# Patient Record
Sex: Male | Born: 1985 | Race: White | Hispanic: No | Marital: Single
Health system: Southern US, Community
[De-identification: ages and names within clinical notes are randomized; demographics above are authoritative.]

---

## 2006-06-08 ENCOUNTER — Emergency Department: Payer: Self-pay | Admitting: General Practice

## 2007-04-09 ENCOUNTER — Emergency Department: Payer: Self-pay | Admitting: Unknown Physician Specialty

## 2010-03-20 ENCOUNTER — Emergency Department: Payer: Self-pay | Admitting: Emergency Medicine

## 2011-10-06 IMAGING — CT CT HEAD WITHOUT CONTRAST
2 series · 15 of 30 positions shown, 19 images · non-contrast
Comparison: none

REASON FOR EXAM: mass and tenderness behind right ear
COMMENTS:   LMP: (Male)

[Series 2: without · axial · non-contrast · 0.50mm/px · z∈[-218,-83]mm · 13 of 33 slices shown, 17 images]
[im 3/33  brain]
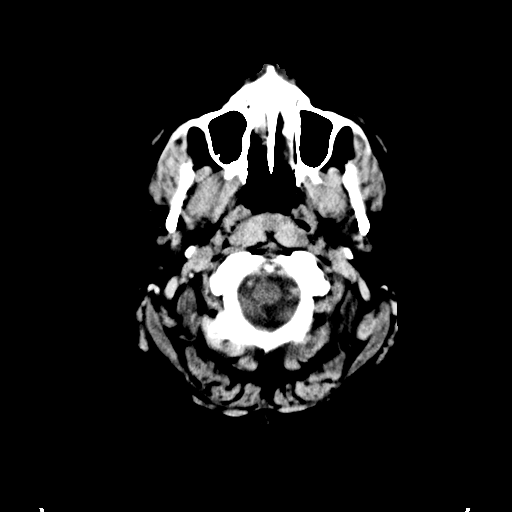
[im 3/33  bone]
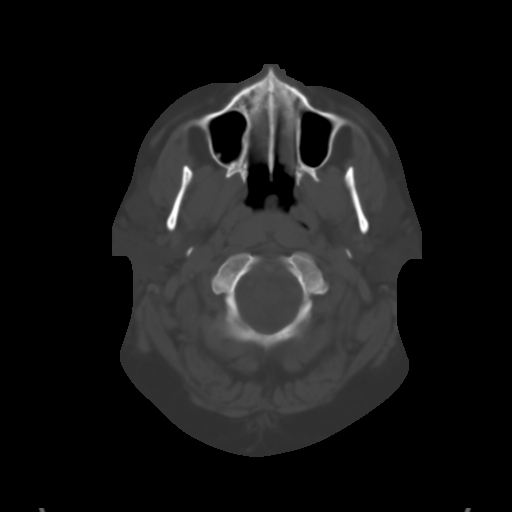
[im 5/33  brain]
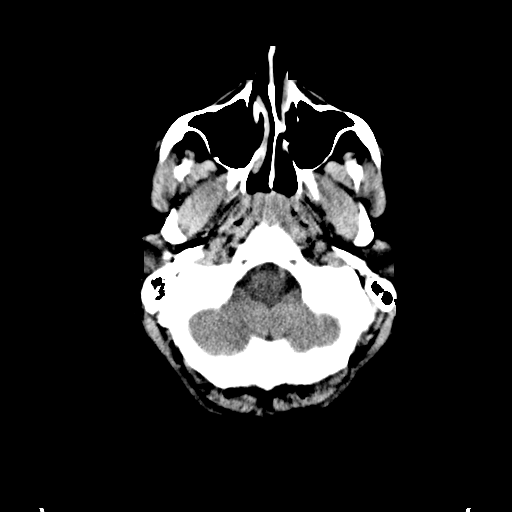
[im 7/33  brain]
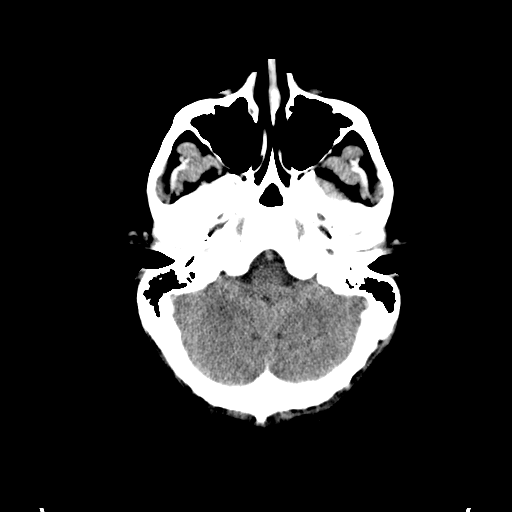
[im 10/33  brain]
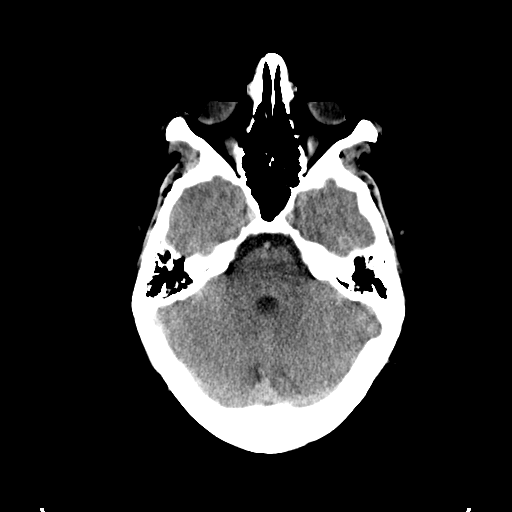
[im 12/33  brain]
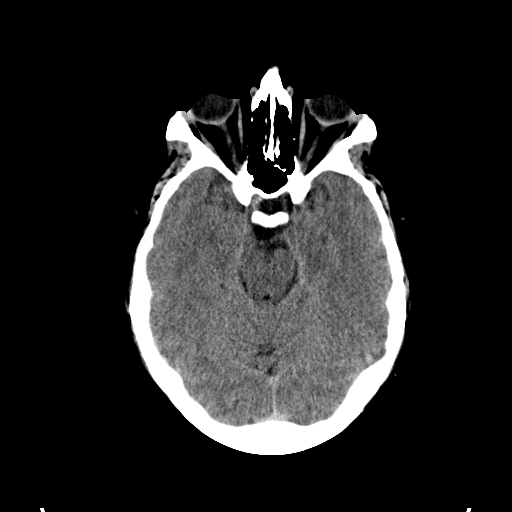
[im 12/33  bone]
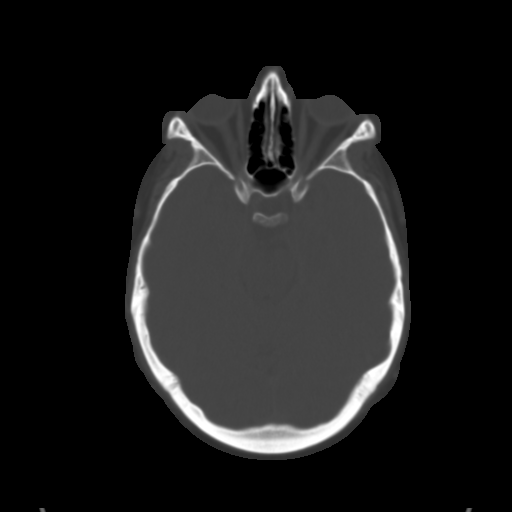
[im 14/33  brain]
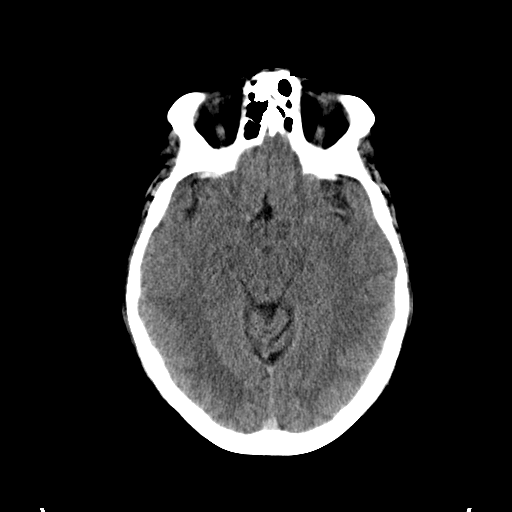
[im 17/33  brain]
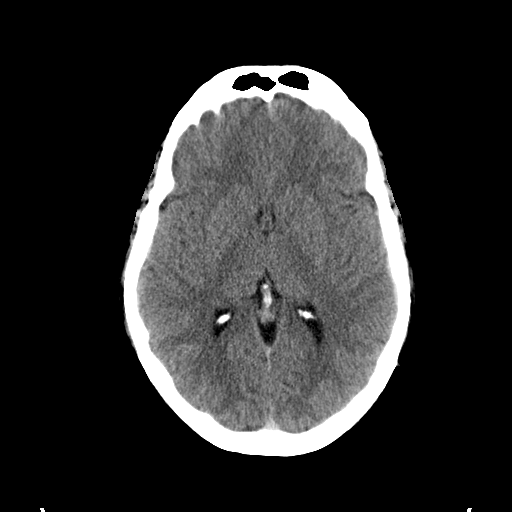
[im 19/33  brain]
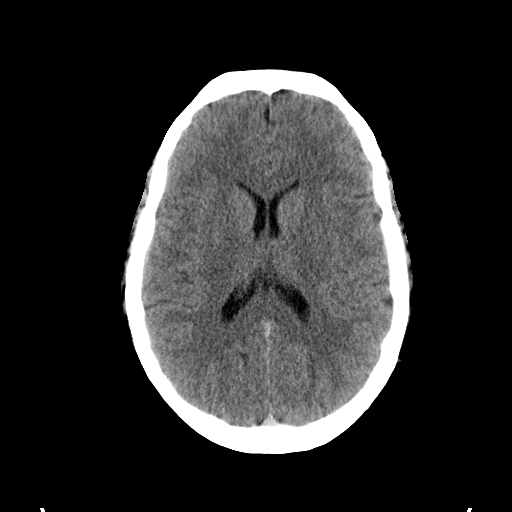
[im 21/33  brain]
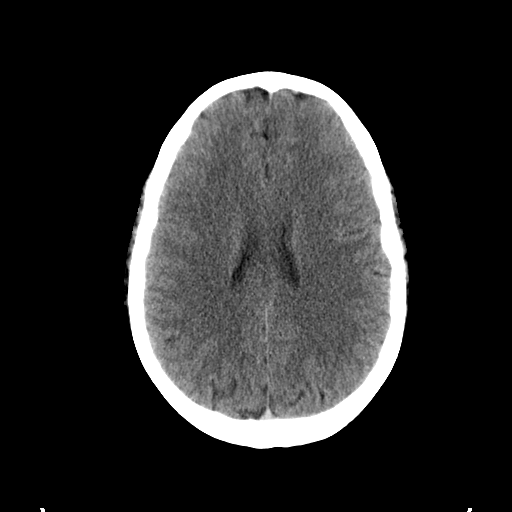
[im 21/33  bone]
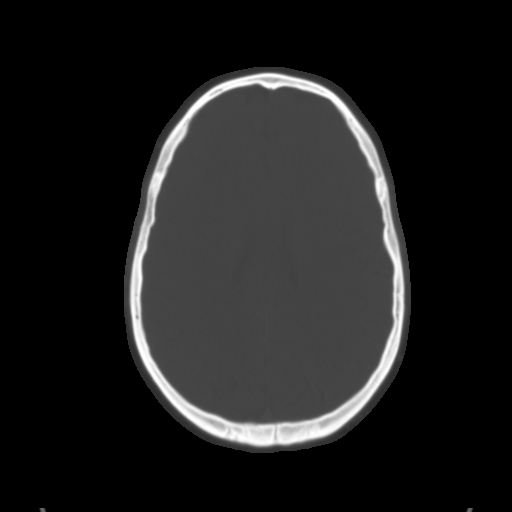
[im 23/33  brain]
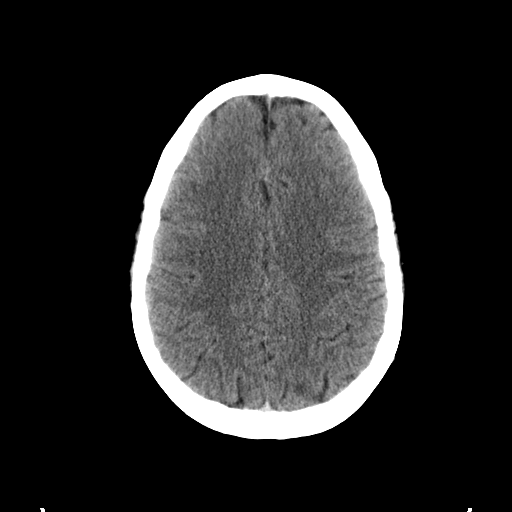
[im 26/33  brain]
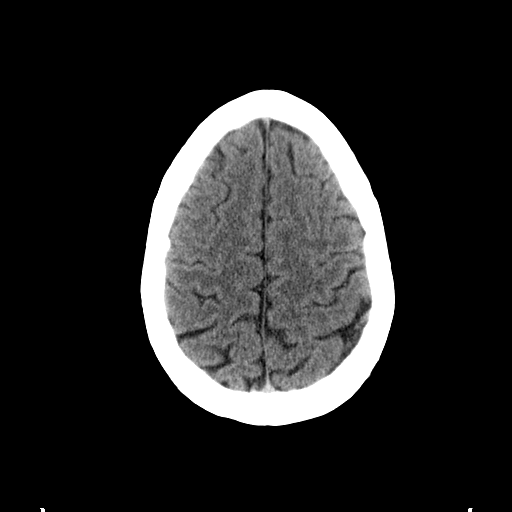
[im 28/33  brain]
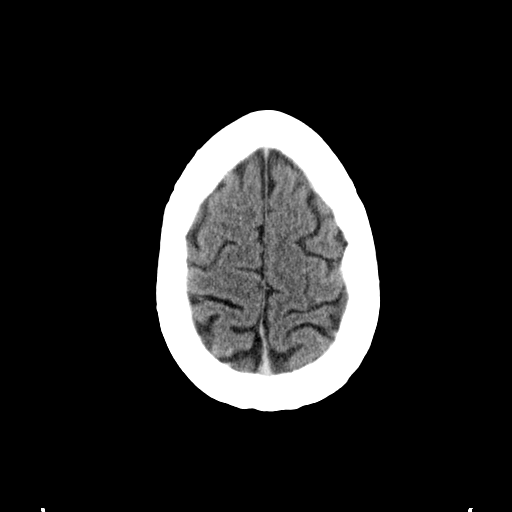
[im 30/33  brain]
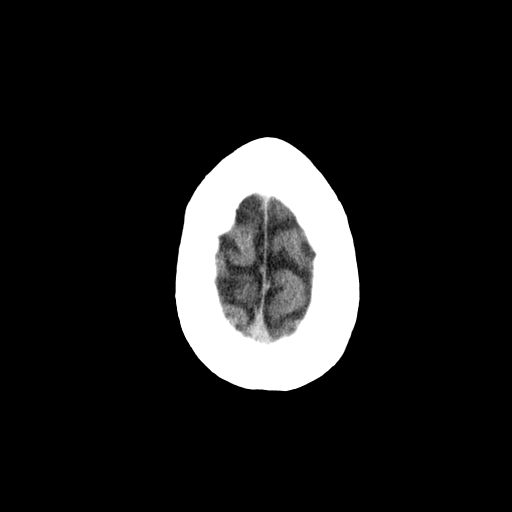
[im 30/33  bone]
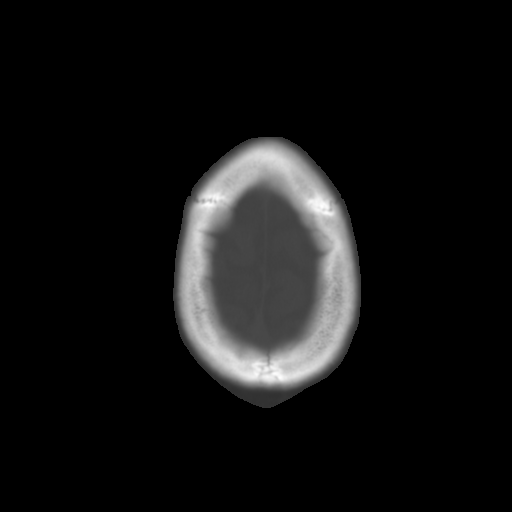

[Series 3: bone · axial · 0.50mm/px · z∈[-218,-198]mm · 2 of 33 slices shown]
[im 3/33  bone]
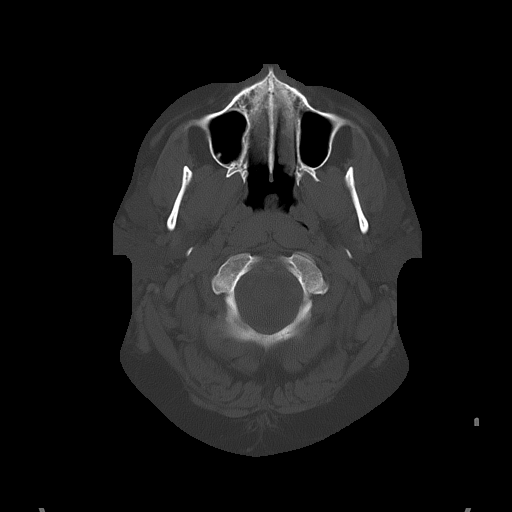
[im 7/33  bone]
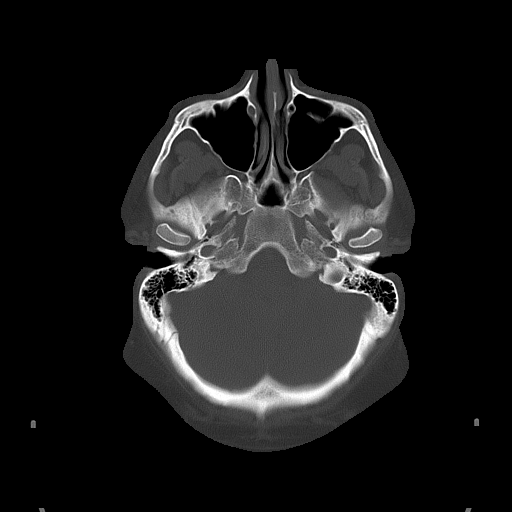

[15 of 30 positions shown; findings below may reference images not displayed]

PROCEDURE:     CT  - CT HEAD WITHOUT CONTRAST  - March 20, 2010  [DATE]

RESULT:     Axial CT scanning was performed through the brain at 5 mm
intervals and slice thicknesses without IV contrast material. Comparison
made to study 09 April, 2007. The ventricles are normal in size and
position. There is no intracranial hemorrhage nor intracranial mass effect.
The cerebellum and brainstem are normal in density. At bone window settings
the mastoid air cells are well pneumatized. The paranasal sinuses are well
pneumatized and clear. There is no evidence of an acute skull fracture.

The posterior auricular region on the right does not demonstrate significant
abnormality. The subcutaneous and deeper fat are within the limits of
normal. There are two subcentimeter lymph nodes in the posterior auricular
region. These were demonstrated on a study 09 April, 2007.
IMPRESSION: 1. I see no acute intracranial abnormality.
2. I do not see objective evidence of abnormality in the posterior are
regular region. There are small lymph nodes in this region but these are not
new. If the patient's symptoms persist and remain unexplained, further
evaluation with MRI may be useful.

## 2014-06-29 ENCOUNTER — Inpatient Hospital Stay: Payer: Self-pay | Admitting: Psychiatry

## 2014-06-29 LAB — COMPREHENSIVE METABOLIC PANEL
Albumin: 4.3 g/dL (ref 3.4–5.0)
Alkaline Phosphatase: 35 U/L — ABNORMAL LOW
Anion Gap: 8 (ref 7–16)
BILIRUBIN TOTAL: 1.4 mg/dL — AB (ref 0.2–1.0)
BUN: 14 mg/dL (ref 7–18)
CO2: 27 mmol/L (ref 21–32)
Calcium, Total: 9.1 mg/dL (ref 8.5–10.1)
Chloride: 103 mmol/L (ref 98–107)
Creatinine: 0.82 mg/dL (ref 0.60–1.30)
EGFR (African American): >60
EGFR (Non-African Amer.): >60
Glucose: 169 mg/dL — ABNORMAL HIGH (ref 65–99)
OSMOLALITY: 280 (ref 275–301)
POTASSIUM: 4 mmol/L (ref 3.5–5.1)
SGOT(AST): 20 U/L (ref 15–37)
SGPT (ALT): 23 U/L (ref 12–78)
SODIUM: 138 mmol/L (ref 136–145)
TOTAL PROTEIN: 7.5 g/dL (ref 6.4–8.2)

## 2014-06-29 LAB — DRUG SCREEN, URINE
Amphetamines, Ur Screen: NEGATIVE (ref ?–1000)
BARBITURATES, UR SCREEN: NEGATIVE (ref ?–200)
BENZODIAZEPINE, UR SCRN: NEGATIVE (ref ?–200)
Cannabinoid 50 Ng, Ur ~~LOC~~: NEGATIVE (ref ?–50)
Cocaine Metabolite,Ur ~~LOC~~: NEGATIVE (ref ?–300)
MDMA (ECSTASY) UR SCREEN: NEGATIVE (ref ?–500)
Methadone, Ur Screen: NEGATIVE (ref ?–300)
Opiate, Ur Screen: NEGATIVE (ref ?–300)
PHENCYCLIDINE (PCP) UR S: NEGATIVE (ref ?–25)
Tricyclic, Ur Screen: NEGATIVE (ref ?–1000)

## 2014-06-29 LAB — URINALYSIS, COMPLETE
BILIRUBIN, UR: NEGATIVE
BLOOD: NEGATIVE
Bacteria: NONE SEEN
Glucose,UR: NEGATIVE mg/dL (ref 0–75)
Ketone: NEGATIVE
LEUKOCYTE ESTERASE: NEGATIVE
NITRITE: NEGATIVE
PH: 6 (ref 4.5–8.0)
PROTEIN: NEGATIVE
Specific Gravity: 1.014 (ref 1.003–1.030)

## 2014-06-29 LAB — CBC
HCT: 47.3 % (ref 40.0–52.0)
HGB: 15.9 g/dL (ref 13.0–18.0)
MCH: 30.8 pg (ref 26.0–34.0)
MCHC: 33.6 g/dL (ref 32.0–36.0)
MCV: 92 fL (ref 80–100)
PLATELETS: 260 10*3/uL (ref 150–440)
RBC: 5.17 10*6/uL (ref 4.40–5.90)
RDW: 14 % (ref 11.5–14.5)
WBC: 9.2 10*3/uL (ref 3.8–10.6)

## 2014-06-29 LAB — ETHANOL: Ethanol %: <0.003 % (ref 0.000–0.080)

## 2014-06-29 LAB — ACETAMINOPHEN LEVEL: Acetaminophen: <2 ug/mL

## 2014-06-29 LAB — SALICYLATE LEVEL: Salicylates, Serum: <1.7 mg/dL

## 2015-04-09 NOTE — H&P (Signed)
PATIENT NAME:  Dylan Sanchez, Dylan Sanchez MR#:  [id number] DATE OF BIRTH:  1986-07-24  DATE OF ADMISSION:  06/29/2014  REFERRING PHYSICIAN: Emergency Room M.D.   ATTENDING PHYSICIAN: Yaakov Saindon B. Jennet Maduro, M.D.   IDENTIFYING DATA: Mr. Amer is a 29 year old male with history of untreated depression and anxiety.   CHIEF COMPLAINT: "I couldn'Sanchez work"   HISTORY OF PRESENT ILLNESS: Mr. Murin reports a history of depression and anxiety for the past 17 years. He always opposed the idea of taking medications and never seeks help. In addition, when he was drinking, his mother committed suicide by a Xanax overdose. She was addicted to benzodiazepines so the patient really avoids any medications including aspirin when he needs it. He, over the years, suffered depression to a different degree. Initially, after losing his mother, he was unable to function, stopped going to school, started acting out. Later, he became less depressed, but more anxious. He has profound attacks of anxiety that, over the years, he learned to deal with. On the day of admission, he was at work. He works at [employer].  The day was uneventful and he started experiencing a major panic attack with shaking, uncontrollable crying. Everybody was interested to see was going on. He is well liked there. He could not control his behavior and decided to go to [facility] for help. He imagines that medications will be prescribed after he sees a counselor and he did disclose that at the times of panic he sometimes feels that if he had a heart attack at this moment he would not care. He adamantly denies feeling suicidal. He would never hurt himself and hurt his 77-year-old son. He cares about his son very deeply.  He now is ready to start treatment for depression and anxiety. He reports poor sleep, decreased appetite, anhedonia, feeling of guilt, hopelessness, worthlessness, poor memory and concentration, social isolation and crying spells, but most of all panic attacks  that are incapacitating. Now he worries  that when he returns to work. He may have another one and be exposed to unpleasant situation when he loses control. He has other forms of anxiety, social anxiety, is not people person. He has difficulties in maintaining eye contact. He is a Haematologist. He has some symptoms suggestive of obsessive-compulsive traits but he does not feel that he spends  too much time and doing his rituals or that they impede his life. He denies psychotic symptoms. He denies symptoms suggestive of bipolar mania. He stopped smoking marijuana in the fall of 2014 as he hopes to get a better job. He stopped smoking cigarettes several weeks ago. Unfortunately, he noticed that when anxious he drinks. He does not have a drinking problem and is gainfully employed but noticed that when anxious he would grab a beer. It worries him.   PAST PSYCHIATRIC HISTORY: As above, he has never been seen by a psychiatrist. No medications. No substance abuse treatment. No suicide attempts.   FAMILY PSYCHIATRIC HISTORY: Mother with mental illness and addiction. Also, he has a sister who has anxiety problems.   PAST MEDICAL HISTORY: None.   ALLERGIES: No known drug allergies.   MEDICATIONS ON ADMISSION: None   SOCIAL HISTORY: He lives with a roommate. He works at [employer]. Finances are tight because he pays alimony. He has an 39-year-old child with a woman he never married. He feels that she is an inadequate mother. They do not have a stable living situation, move from 1 place to another. The patient gets to see his  son on Fridays, Saturdays and Sundays when he is not working. Unfortunately, his financial situation and working hours prohibit him from taking  over care for his son.   REVIEW OF SYSTEMS:    CONSTITUTIONAL: No fevers or chills. No weight changes.  EYES: No double or blurred vision.  ENT: No hearing loss.  RESPIRATORY: No shortness of breath or cough.  CARDIOVASCULAR: No chest pain or orthopnea.   GASTROINTESTINAL: No abdominal pain, nausea, vomiting or diarrhea.  GENITOURINARY: No incontinence or frequency.  ENDOCRINE: No heat or cold intolerance.  LYMPHATIC: No anemia or easy bruising.  INTEGUMENTARY: No acne or rash.  MUSCULOSKELETAL: No muscle or joint pain.  NEUROLOGIC: No tingling or weakness.  PSYCHIATRIC: See history of present illness for details.   PHYSICAL EXAMINATION: VITAL SIGNS: Blood pressure 118/72, pulse is 60, respirations 20, temperature 97.9.  GENERAL: This is a well-developed young male in no acute distress.  HEENT: The pupils are equal, round and reactive to light. Sclerae are anicteric.  NECK: Supple. No thyromegaly.  LUNGS: Clear to auscultation. No dullness to percussion.  HEART: Regular rhythm and rate. No murmurs, rubs or gallops.  ABDOMEN: Soft, nontender, nondistended. Positive bowel sounds.  MUSCULOSKELETAL: Normal muscle strength in all extremities.  SKIN: No rashes or bruises.  LYMPHATIC: No cervical adenopathy.  NEUROLOGIC: Cranial nerves II through XII are intact.   LABORATORY, DIAGNOSTIC AND RADIOLOGICAL DATA:  Chemistries are within normal limits except for blood glucose of 169. Blood alcohol level is zero. LFTs within normal limits except for total bilirubin of 1.4. Urine tox screen negative for substances. CBC within normal limits. Urinalysis is not suggestive of urinary tract infection. Serum acetaminophen and salicylates are low.   MENTAL STATUS EXAMINATION: The patient is alert and oriented to person, place, time and situation. He is pleasant, polite and cooperative. He is well groomed and casually dressed. He maintains limited eye contact initially but does much better towards the end of the interview. His mood is fine with anxious affect. Thought process is logical and goal oriented. He denies thoughts of hurting himself or others. There are no delusions or paranoia. There are no auditory or visual hallucinations. His cognition is grossly  intact. His insight and judgment are fair. Registration and recall, short and long-term memory are intact. He is of average intelligence and fund of knowledge. His insight and judgment are good.   SUICIDE RISK ASSESSMENT: This is a patient with a long history of untreated depression and anxiety, who came to our attention following a major panic attack at work. The patient was sent to the hospital when he disclosed that when very anxious he wishes he would die of a heart attack. He adamantly denies any intention or plans to hurt himself. He is a loving father. He is ready to start medications. He is forward-thinking and optimistic about the future.   DIAGNOSES: AXIS I:  Major depressive disorder, recurrent, moderate; panic disorder with agoraphobia.  AXIS II: Deferred.  AXIS III: Deferred.  AXIS IV: Mental illness.  AXIS V: Global assessment of functioning 60.   PLAN:  The patient was briefly admitted to [facility] for safety, stabilization and medication management. He was initially placed on suicide precautions and was closely monitored for any unsafe behaviors. He underwent full psychiatric and risk assessment. He received pharmacotherapy, individual and group psychotherapy, substance abuse counseling and support from therapeutic milieu.  1.  Suicidal ideation. The patient adamantly denies and is able to contract for  safety.  2.  Mood and anxiety. We started Prozac for mood and panic disorder. Will add hydroxyzine for anxiety and low-dose Xanax to take in case of a major panic attack, especially at work.  3.  Disposition. He is discharged to home. He will follow up with [facility].   MEDICATIONS ON DISCHARGE:  Trazodone 100 mg at night, Prozac 20 mg at bedtime, hydroxyzine 25 mg 4 times daily, Xanax 0.5 mg once daily as needed for panic attacks.   ____________________________ Ellin GoodieJolanta B. Jennet MaduroPucilowska, MD jbp:cs D: 06/30/2014 18:05:53 ET Sanchez: 06/30/2014  19:13:27 ET JOB#: [id number]  cc: Kenslei Hearty B. Jennet MaduroPucilowska, MD, <Dictator> Shari ProwsJOLANTA B Joella Saefong MD ELECTRONICALLY SIGNED 07/08/2014 7:19
